# Patient Record
Sex: Female | Born: 1980 | Race: White | Hispanic: No | Marital: Single | Smoking: Never smoker
Health system: Southern US, Community
[De-identification: ages and names within clinical notes are randomized; demographics above are authoritative.]

## PROBLEM LIST (undated history)

## (undated) DIAGNOSIS — K5010 Crohn's disease of large intestine without complications: Secondary | ICD-10-CM

## (undated) DIAGNOSIS — M1990 Unspecified osteoarthritis, unspecified site: Secondary | ICD-10-CM

## (undated) DIAGNOSIS — M797 Fibromyalgia: Secondary | ICD-10-CM

## (undated) DIAGNOSIS — J45909 Unspecified asthma, uncomplicated: Secondary | ICD-10-CM

## (undated) DIAGNOSIS — E876 Hypokalemia: Secondary | ICD-10-CM

## (undated) DIAGNOSIS — E279 Disorder of adrenal gland, unspecified: Secondary | ICD-10-CM

## (undated) DIAGNOSIS — E039 Hypothyroidism, unspecified: Secondary | ICD-10-CM

## (undated) HISTORY — PX: KNEE ARTHROSCOPY: SUR90

## (undated) HISTORY — PX: TYMPANOSTOMY: SHX2586

## (undated) HISTORY — PX: CLEFT PALATE REPAIR: SUR1165

## (undated) HISTORY — PX: NASAL SINUS SURGERY: SHX719

## (undated) HISTORY — PX: INNER EAR SURGERY: SHX679

---

## 2016-01-27 ENCOUNTER — Encounter: Payer: Self-pay | Admitting: Emergency Medicine

## 2016-01-27 ENCOUNTER — Emergency Department
Admission: EM | Admit: 2016-01-27 | Discharge: 2016-01-27 | Disposition: A | Discharge status: Home / Self Care | Payer: No Typology Code available for payment source | Attending: Emergency Medicine | Admitting: Emergency Medicine

## 2016-01-27 ENCOUNTER — Emergency Department: Payer: No Typology Code available for payment source

## 2016-01-27 DIAGNOSIS — Y939 Activity, unspecified: Secondary | ICD-10-CM | POA: Insufficient documentation

## 2016-01-27 DIAGNOSIS — S4991XA Unspecified injury of right shoulder and upper arm, initial encounter: Secondary | ICD-10-CM | POA: Diagnosis present

## 2016-01-27 DIAGNOSIS — E039 Hypothyroidism, unspecified: Secondary | ICD-10-CM | POA: Diagnosis not present

## 2016-01-27 DIAGNOSIS — Z9622 Myringotomy tube(s) status: Secondary | ICD-10-CM | POA: Diagnosis not present

## 2016-01-27 DIAGNOSIS — S40011A Contusion of right shoulder, initial encounter: Secondary | ICD-10-CM | POA: Diagnosis not present

## 2016-01-27 DIAGNOSIS — M25511 Pain in right shoulder: Secondary | ICD-10-CM

## 2016-01-27 DIAGNOSIS — J45909 Unspecified asthma, uncomplicated: Secondary | ICD-10-CM | POA: Insufficient documentation

## 2016-01-27 DIAGNOSIS — Y999 Unspecified external cause status: Secondary | ICD-10-CM | POA: Insufficient documentation

## 2016-01-27 DIAGNOSIS — S5001XA Contusion of right elbow, initial encounter: Secondary | ICD-10-CM | POA: Insufficient documentation

## 2016-01-27 DIAGNOSIS — T07XXXA Unspecified multiple injuries, initial encounter: Secondary | ICD-10-CM

## 2016-01-27 DIAGNOSIS — Y92410 Unspecified street and highway as the place of occurrence of the external cause: Secondary | ICD-10-CM | POA: Diagnosis not present

## 2016-01-27 DIAGNOSIS — M1990 Unspecified osteoarthritis, unspecified site: Secondary | ICD-10-CM | POA: Diagnosis not present

## 2016-01-27 HISTORY — DX: Hypothyroidism, unspecified: E03.9

## 2016-01-27 HISTORY — DX: Unspecified osteoarthritis, unspecified site: M19.90

## 2016-01-27 HISTORY — DX: Unspecified asthma, uncomplicated: J45.909

## 2016-01-27 HISTORY — DX: Crohn's disease of large intestine without complications: K50.10

## 2016-01-27 HISTORY — DX: Hypocalcemia: E83.51

## 2016-01-27 HISTORY — DX: Disorder of adrenal gland, unspecified: E27.9

## 2016-01-27 HISTORY — DX: Fibromyalgia: M79.7

## 2016-01-27 HISTORY — DX: Hypokalemia: E87.6

## 2016-01-27 MED ORDER — PROMETHAZINE HCL 25 MG PO TABS
12.5000 mg | ORAL_TABLET | Freq: Once | ORAL | Status: AC
  Administered 2016-01-27: 12.5 mg via ORAL
  Filled 2016-01-27: qty 1

## 2016-01-27 MED ORDER — MEPERIDINE HCL 50 MG/ML IJ SOLN
50.0000 mg | Freq: Once | INTRAMUSCULAR | Status: AC
  Administered 2016-01-27: 50 mg via INTRAVENOUS
  Filled 2016-01-27: qty 1

## 2016-01-27 MED ORDER — ACETAMINOPHEN-CODEINE #3 300-30 MG PO TABS: 1.0000 | ORAL_TABLET | ORAL | Status: AC | PRN
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
## 2018-01-06 DEATH — deceased
